# Patient Record
Sex: Male | Born: 1990 | Race: Black or African American | Hispanic: No | Marital: Single | Smoking: Former smoker
Health system: Southern US, Community
[De-identification: ages and names within clinical notes are randomized; demographics above are authoritative.]

## PROBLEM LIST (undated history)

## (undated) HISTORY — PX: KNEE SURGERY: SHX244

---

## 2011-07-18 ENCOUNTER — Emergency Department (HOSPITAL_COMMUNITY): Payer: Self-pay

## 2011-07-18 ENCOUNTER — Emergency Department (HOSPITAL_COMMUNITY)
Admission: EM | Admit: 2011-07-18 | Discharge: 2011-07-18 | Disposition: A | Discharge status: Home / Self Care | Payer: Self-pay | Attending: Emergency Medicine | Admitting: Emergency Medicine

## 2011-07-18 DIAGNOSIS — R0789 Other chest pain: Secondary | ICD-10-CM | POA: Insufficient documentation

## 2012-03-08 ENCOUNTER — Encounter (HOSPITAL_COMMUNITY): Payer: Self-pay | Admitting: Emergency Medicine

## 2012-03-08 ENCOUNTER — Emergency Department (HOSPITAL_COMMUNITY)
Admission: EM | Admit: 2012-03-08 | Discharge: 2012-03-09 | Disposition: A | Discharge status: Home / Self Care | Payer: Self-pay | Attending: Emergency Medicine | Admitting: Emergency Medicine

## 2012-03-08 DIAGNOSIS — B09 Unspecified viral infection characterized by skin and mucous membrane lesions: Secondary | ICD-10-CM | POA: Insufficient documentation

## 2012-03-08 DIAGNOSIS — J069 Acute upper respiratory infection, unspecified: Secondary | ICD-10-CM | POA: Insufficient documentation

## 2012-03-08 NOTE — ED Notes (Signed)
PT reports rash for a week on arms, back, stomach and neck. Has not taken meds for it. Reports itching and no other symptoms.

## 2012-03-09 MED ORDER — DIPHENHYDRAMINE HCL 25 MG PO TABS: 25.0000 mg | ORAL_TABLET | Freq: Four times a day (QID) | ORAL | Status: DC

## 2012-03-09 MED ORDER — HYDROCORTISONE 0.5 % EX CREA: TOPICAL_CREAM | Freq: Two times a day (BID) | CUTANEOUS | Status: DC

## 2012-03-09 NOTE — ED Provider Notes (Signed)
Medical screening examination/treatment/procedure(s) were performed by non-physician practitioner and as supervising physician I was immediately available for consultation/collaboration.   Laray Anger, DO 03/09/12 1014

## 2012-03-09 NOTE — Discharge Instructions (Signed)
Viral Exanthems, Adult  Many viral infections of the skin are called viral exanthems. Exanthem is another name for a rash or skin eruption. The most common viral exanthems include the following:   German measles or rubella.   Measles or rubeola.   Roseola.   Parvovirus B19 (Erythema infectiosum or Fifth disease).   Chickenpox or varicella.  DIAGNOSIS   Sometimes, other problems may cause a rash that looks like a viral exanthem. Most often, your caregiver can determine whether you have a viral exanthem by looking at the rash. They usually have distinct patterns or appearance. Lab work may be done if the diagnosis is uncertain. Sometimes, a small tissue sample (biopsy) of the rash may need to be taken.  TREATMENT   Immunizations have led to a decrease in the number of cases of measles, mumps, and rubella. Viral exanthems may require clinical treatment if a bacterial infection or other problems follow. The rash may be associated with:   Minor sore throat.   Aches and pains.   Runny nose.   Watery eyes.   Tiredness.   Some coughs.   Gastrointestinal infections causing nausea, vomiting, and diarrhea.  Viral exanthems do not respond to antibiotic medicines, because they are not caused by bacteria.  HOME CARE INSTRUCTIONS    Only take over-the-counter or prescription medicines for pain, discomfort, diarrhea, or fever as directed by your caregiver.   Drink enough water and fluids to keep your urine clear or pale yellow.  SEEK MEDICAL CARE IF:   You develop swollen neck glands. This may feel like lumps or bumps in the neck.   You develop tenderness over your sinuses.   You are not feeling partly better after 3 days.   You develop muscle aches.   You are feeling more tired than you would expect.   You get a persistent cough with mucus.  SEEK IMMEDIATE MEDICAL CARE IF:    You have a fever.   You develop red eyes or eye pain.   You develop sores in your mouth and difficulty drinking or eating.   You  develop a sore throat with pus and difficulty swallowing.   You develop neck pain or a stiff neck.   You develop a severe headache.   You develop vomiting that will not stop.  Document Released: 01/31/2003 Document Revised: 10/30/2011 Document Reviewed: 01/28/2011  [employer] Patient Information 2012 [employer].

## 2012-03-09 NOTE — ED Notes (Signed)
Pt states rash on right arm and around neck. Pt states that it is itching and was concerned when it spread to his neck. Pt has red bumps down arm.

## 2012-03-09 NOTE — ED Provider Notes (Signed)
History     CSN: [id number]  Arrival date & time 03/08/12  2043   First MD Initiated Contact with Patient 03/09/12 0001      Chief Complaint  Patient presents with  . Rash    (Consider location/radiation/quality/duration/timing/severity/associated sxs/prior treatment) HPI  21 year old male presents with a chief complaint of rash. Patient has noticed a rash for the past week. Rash initially started on his right forearm that radiates up to his arm, and now he noticed rash to the back of his neck, and also to his abdomen. Describe rash was gradual onset. Rash is itching, but denies burning sensation or pus drainage. Patient denies environmental changes, new detergent, new pets, travel to the [place], or medication changes. He denies fever, or headache. States he has been having cold symptoms for the same amount of time, with sneezing, coughing, and runny nose. Has not tried anything to alleviate his symptoms. He denies focal swelling, chest pain, shortness of breath, nausea, vomiting, diarrhea, or abdominal pain.  No past medical history on file.  No past surgical history on file.  No family history on file.  History  Substance Use Topics  . Smoking status: Not on file  . Smokeless tobacco: Not on file  . Alcohol Use: Not on file      Review of Systems  All other systems reviewed and are negative.    Allergies  Review of patient's allergies indicates no known allergies.  Home Medications  No current outpatient prescriptions on file.  BP 112/72  Pulse 83  Temp(Src) 97.4 F (36.3 C) (Oral)  Resp 18  SpO2 100%  Physical Exam  Nursing note and vitals reviewed. Constitutional: He is oriented to person, place, and time. He appears well-developed and well-nourished. No distress.  HENT:  Head: Normocephalic and atraumatic.  Right Ear: External ear normal.  Nose: Rhinorrhea present.  Mouth/Throat: Oropharynx is clear and moist. No oropharyngeal exudate.  Eyes:  Conjunctivae and EOM are normal.  Neck: Neck supple.  Cardiovascular: Normal rate and regular rhythm.   Pulmonary/Chest: Effort normal and breath sounds normal. No respiratory distress. He has no wheezes. He exhibits no tenderness.  Abdominal: Soft. Bowel sounds are normal. He exhibits no distension. There is no tenderness.  Musculoskeletal: Normal range of motion.  Lymphadenopathy:    He has no cervical adenopathy.  Neurological: He is alert and oriented to person, place, and time.  Skin: Skin is warm. Rash noted.       Maculopapular rash noted to right forearm, posterior aspects of neck, and abdomen. Rash is not petechial, non-pustular. No erythema noted.     ED Course  Procedures (including critical care time)  Labs Reviewed - No data to display No results found.   No diagnosis found.    MDM  Patient with viral exanthem, likely secondary to URI. No evidence of GI involvement. Patient is afebrile stable normal vital signs. No one else in family has similar rash. Rash does not affect the palms of his hand or the sole of his foot.  I recommend Benadryl, and hydrocortisone cream for symptom treatment. Patient agrees to follow with his primary care Dr. for further evaluation.        Fayrene Helper, PA-C 03/09/12 [phone]

## 2013-02-23 ENCOUNTER — Emergency Department (HOSPITAL_COMMUNITY)
Admission: EM | Admit: 2013-02-23 | Discharge: 2013-02-23 | Disposition: A | Discharge status: Home / Self Care | Payer: Self-pay | Attending: Emergency Medicine | Admitting: Emergency Medicine

## 2013-02-23 ENCOUNTER — Encounter (HOSPITAL_COMMUNITY): Payer: Self-pay | Admitting: Emergency Medicine

## 2013-02-23 DIAGNOSIS — R197 Diarrhea, unspecified: Secondary | ICD-10-CM | POA: Insufficient documentation

## 2013-02-23 DIAGNOSIS — R6883 Chills (without fever): Secondary | ICD-10-CM | POA: Insufficient documentation

## 2013-02-23 DIAGNOSIS — Z87891 Personal history of nicotine dependence: Secondary | ICD-10-CM | POA: Insufficient documentation

## 2013-02-23 DIAGNOSIS — R109 Unspecified abdominal pain: Secondary | ICD-10-CM | POA: Insufficient documentation

## 2013-02-23 DIAGNOSIS — R112 Nausea with vomiting, unspecified: Secondary | ICD-10-CM | POA: Insufficient documentation

## 2013-02-23 LAB — COMPREHENSIVE METABOLIC PANEL
ALT: 13 U/L (ref 0–53)
Alkaline Phosphatase: 60 U/L (ref 39–117)
BUN: 19 mg/dL (ref 6–23)
CO2: 26 mEq/L (ref 19–32)
GFR calc Af Amer: >90 mL/min (ref >90)
GFR calc non Af Amer: >90 mL/min (ref >90)
Glucose, Bld: 109 mg/dL — ABNORMAL HIGH (ref 70–99)
Potassium: 4 mEq/L (ref 3.5–5.1)
Sodium: 141 mEq/L (ref 135–145)
Total Bilirubin: 0.4 mg/dL (ref 0.3–1.2)

## 2013-02-23 LAB — CBC
HCT: 40.1 % (ref 39.0–52.0)
Hemoglobin: 13.4 g/dL (ref 13.0–17.0)
RBC: 4.9 MIL/uL (ref 4.22–5.81)

## 2013-02-23 MED ORDER — SODIUM CHLORIDE 0.9 % IV SOLN
1000.0000 mL | Freq: Once | INTRAVENOUS | Status: AC
  Administered 2013-02-23: 1000 mL via INTRAVENOUS

## 2013-02-23 MED ORDER — ONDANSETRON 8 MG PO TBDP: 8.0000 mg | ORAL_TABLET | Freq: Three times a day (TID) | ORAL | Status: DC | PRN

## 2013-02-23 MED ORDER — ONDANSETRON HCL 4 MG/2ML IJ SOLN
4.0000 mg | Freq: Once | INTRAMUSCULAR | Status: AC
  Administered 2013-02-23: 4 mg via INTRAVENOUS
  Filled 2013-02-23: qty 2

## 2013-02-23 MED ORDER — SODIUM CHLORIDE 0.9 % IV SOLN: 1000.0000 mL | INTRAVENOUS | Status: DC

## 2013-02-23 MED ORDER — MORPHINE SULFATE 4 MG/ML IJ SOLN
4.0000 mg | Freq: Once | INTRAMUSCULAR | Status: AC
  Administered 2013-02-23: 4 mg via INTRAVENOUS
  Filled 2013-02-23: qty 1

## 2013-02-23 NOTE — ED Notes (Signed)
Lab in room at this time. 

## 2013-02-23 NOTE — ED Notes (Signed)
Patient with abdominal pain, nausea, vomiting and diarrhea.  Patient states that the pain started after he ate dinner last night.  Patient states that the vomiting started around 2300, along with diarrhea.  Patient states that the abdominal pain is continuous, stating that the pain is all over upper abdomen.

## 2013-02-23 NOTE — ED Notes (Signed)
Patient given discharge instructions for N/V/D. rx for zofran. Advised to follow up with primary care or to return to this dept if condition worsens. Patient voiced understanding of all instructions and had no further questions. Patient ambulated to front lobby without difficulty.

## 2013-02-23 NOTE — ED Notes (Signed)
Patient provided cold rag, states he is hot at this time. Temp was 99.6 oral.

## 2013-02-23 NOTE — ED Provider Notes (Signed)
History     CSN: [id number]  Arrival date & time 02/23/13  0419   First MD Initiated Contact with Patient 02/23/13 0425      Chief Complaint  Patient presents with  . Nausea  . Emesis  . Diarrhea    (Consider location/radiation/quality/duration/timing/severity/associated sxs/prior treatment) The history is provided by the patient.   the patient reports nausea vomiting and diarrhea over the past 9 hours.  He denies blood in his stool or blood in his vomit.  Chills without documented fever.  He reports crampy upper abdominal discomfort without focality.  No recent sick contacts.  No flank pain.  No back pain.  No other complaints.  He is healthy young at 22 years old.  His symptoms are mild to moderate in severity.  History reviewed. No pertinent past medical history.  History reviewed. No pertinent past surgical history.  History reviewed. No pertinent family history.  History  Substance Use Topics  . Smoking status: Former Games developer  . Smokeless tobacco: Not on file  . Alcohol Use: No     Comment: socially      Review of Systems  Gastrointestinal: Positive for vomiting and diarrhea.  All other systems reviewed and are negative.    Allergies  Review of patient's allergies indicates no known allergies.  Home Medications   Current Outpatient Rx  Name  Route  Sig  Dispense  Refill  . ondansetron (ZOFRAN ODT) 8 MG disintegrating tablet   Oral   Take 1 tablet (8 mg total) by mouth every 8 (eight) hours as needed for nausea.   10 tablet   0     BP 117/61  Pulse 95  Temp(Src) 99.6 F (37.6 C) (Oral)  Resp 20  SpO2 100%  Physical Exam  Nursing note and vitals reviewed. Constitutional: He is oriented to person, place, and time. He appears well-developed and well-nourished.  HENT:  Head: Normocephalic and atraumatic.  Eyes: EOM are normal.  Neck: Normal range of motion.  Cardiovascular: Normal rate, regular rhythm, normal heart sounds and intact distal pulses.    Pulmonary/Chest: Effort normal and breath sounds normal. No respiratory distress.  Abdominal: Soft. He exhibits no distension. There is no tenderness.  Musculoskeletal: Normal range of motion.  Neurological: He is alert and oriented to person, place, and time.  Skin: Skin is warm and dry.  Psychiatric: He has a normal mood and affect. Judgment normal.    ED Course  Procedures (including critical care time)  Labs Reviewed  COMPREHENSIVE METABOLIC PANEL - Abnormal; Notable for the following:    Glucose, Bld 109 (*)    All other components within normal limits  CBC   No results found.   1. Nausea vomiting and diarrhea       MDM  Likely viral gastroenteritis. abd benign on exam. Doubt appendicitis. A zofran given. Non toxic appearance.   6:46 AM Patient feels much better after pain medicine and IV nausea medicine.  He was given fluids.  His repeat abdominal exam is benign.  Likely viral in nature.  Discharge home in good condition.         Lyanne Co, MD 02/23/13 [phone]

## 2013-02-23 NOTE — ED Notes (Signed)
Patient laying on stretcher at this time. Patient is complaining of abdominal pain, nausea, vomiting and diarrhea. Patient is guarding and moaning in pain. Plan of care discussed with patient. Family member at bedside with patient. Call light at bedside. Will continue to monitor patient.

## 2013-02-23 NOTE — ED Notes (Signed)
Patient sleeping comfortably at this time. Awoke patient, states he is feeling better. Still experiencing some pain. No acute distress noted, resp are even and unlabored. Family member at bedside. Call light at bedside. Will continue to monitor.

## 2014-01-31 ENCOUNTER — Encounter (HOSPITAL_COMMUNITY): Payer: Self-pay | Admitting: Emergency Medicine

## 2014-01-31 ENCOUNTER — Emergency Department (HOSPITAL_COMMUNITY): Payer: Self-pay

## 2014-01-31 ENCOUNTER — Emergency Department (HOSPITAL_COMMUNITY)
Admission: EM | Admit: 2014-01-31 | Discharge: 2014-01-31 | Disposition: A | Discharge status: Home / Self Care | Payer: Self-pay | Attending: Emergency Medicine | Admitting: Emergency Medicine

## 2014-01-31 DIAGNOSIS — M7021 Olecranon bursitis, right elbow: Secondary | ICD-10-CM

## 2014-01-31 DIAGNOSIS — Z87891 Personal history of nicotine dependence: Secondary | ICD-10-CM | POA: Insufficient documentation

## 2014-01-31 DIAGNOSIS — M25521 Pain in right elbow: Secondary | ICD-10-CM

## 2014-01-31 DIAGNOSIS — M7020 Olecranon bursitis, unspecified elbow: Secondary | ICD-10-CM | POA: Insufficient documentation

## 2014-01-31 MED ORDER — IBUPROFEN 800 MG PO TABS: 800.0000 mg | ORAL_TABLET | Freq: Three times a day (TID) | ORAL | Status: AC

## 2014-01-31 MED ORDER — ACETAMINOPHEN-CODEINE #3 300-30 MG PO TABS
1.0000 | ORAL_TABLET | Freq: Four times a day (QID) | ORAL | Status: AC | PRN
Start: 2014-01-31 — End: ?

## 2014-01-31 MED ORDER — IBUPROFEN 400 MG PO TABS
800.0000 mg | ORAL_TABLET | Freq: Once | ORAL | Status: DC
  Filled 2014-01-31: qty 2

## 2014-01-31 NOTE — ED Provider Notes (Signed)
CSN: [id number]     Arrival date & time 01/31/14  2014 History   First MD Initiated Contact with Patient 01/31/14 2023 This chart was scribed for non-physician practitioner Francee PiccoloJennifer Cleotis Sparr, PA-C working with Leonette Mostharles B. Bernette MayersSheldon, MD by Valera CastleSteven Perry, ED scribe. This patient was seen in room TR07C/TR07C and the patient's care was started at 9:59 PM.     Chief Complaint  Patient presents with  . Arm Pain   (Consider location/radiation/quality/duration/timing/severity/associated sxs/prior Treatment) The history is provided by the patient. No language interpreter was used.   HPI Comments: Willie Ramsey is a 23 y.o. male who presents to the Emergency Department complaining of constant, right elbow pain, over the back of his elbow, that radiates down his right arm, with associated right hand numbness, onset earlier this evening while sitting down. He reports associated intermittent pins and needles sensation in his right hand. He denies any obvious injury. He denies any repetitive arm motion at work or with sports. He denies lifting heavy objects recently. He denies taking any pain medication and denies applying ice. He denies h/o right elbow problem, surgeries, recent travel, recent casts. He denies SOB, and any other associated symptoms.   He experienced an episode of anxiety with an episode of increased pain, with associated palpitations, feeling flushed and nauseated, but states the symptoms have passed and only lasted a few seconds, and is preferable to not talking about it.   PCP Lafe Garin- KALLIANOS,JOHN, MD  History reviewed. No pertinent past medical history. History reviewed. No pertinent past surgical history. History reviewed. No pertinent family history. History  Substance Use Topics  . Smoking status: Former Games developermoker  . Smokeless tobacco: Not on file  . Alcohol Use: No     Comment: socially    Review of Systems  Constitutional: Negative for fever and chills.  Respiratory: Negative for  shortness of breath.   Musculoskeletal: Positive for arthralgias (right elbow).  All other systems reviewed and are negative.   Allergies  Review of patient's allergies indicates no known allergies.  Home Medications   Current Outpatient Rx  Name  Route  Sig  Dispense  Refill  . Multiple Vitamin (MULTI-VITAMIN PO)   Oral   Take 1 tablet by mouth daily.         Marland Kitchen. acetaminophen-codeine (TYLENOL #3) 300-30 MG per tablet   Oral   Take 1-2 tablets by mouth every 6 (six) hours as needed for severe pain.   10 tablet   0   . ibuprofen (ADVIL,MOTRIN) 800 MG tablet   Oral   Take 1 tablet (800 mg total) by mouth 3 (three) times daily.   21 tablet   0    BP 115/74  Pulse 68  Temp(Src) 98.2 F (36.8 C) (Oral)  Resp 16  SpO2 100%  Physical Exam  Nursing note and vitals reviewed. Constitutional: He is oriented to person, place, and time. He appears well-developed and well-nourished. No distress.  HENT:  Head: Normocephalic and atraumatic.  Right Ear: External ear normal.  Left Ear: External ear normal.  Nose: Nose normal.  Mouth/Throat: Oropharynx is clear and moist.  Eyes: Conjunctivae are normal.  Neck: Normal range of motion. Neck supple.  Cardiovascular: Normal rate, regular rhythm, normal heart sounds and intact distal pulses.   Pulmonary/Chest: Effort normal and breath sounds normal. No respiratory distress. He exhibits no tenderness.  Abdominal: Soft. There is no tenderness.  Musculoskeletal: Normal range of motion.       Right shoulder: Normal.  Right elbow: He exhibits normal range of motion, no swelling, no effusion, no deformity and no laceration. Tenderness found. Olecranon process tenderness noted.       Left elbow: Normal.       Right wrist: Normal.       Left wrist: Normal.       Right upper arm: Normal.       Right forearm: Normal.       Left forearm: Normal.       Right hand: Normal.       Left hand: Normal.  Neurological: He is alert and oriented  to person, place, and time.  Skin: Skin is warm and dry. He is not diaphoretic.  Psychiatric: He has a normal mood and affect.    ED Course  Procedures (including critical care time)  COORDINATION OF CARE: 10:04 PM-Discussed treatment plan which includes ice and pain medication with pt at bedside and pt agreed to plan.   Dg Elbow Complete Right  01/31/2014   CLINICAL DATA Sudden pain in the elbow  EXAM RIGHT ELBOW - COMPLETE 3+ VIEW  COMPARISON None.  FINDINGS There is no acute fracture or dislocation. There is no arthropathy. There is visualization of the posterior fat pad suggesting a joint effusion.  IMPRESSION No acute osseous injury of the right elbow. Nonspecific small joint effusion. If there is clinical concern regarding an occult injury, follow-up radiographs or MRI of the right elbow is recommended.  SIGNATURE  Electronically Signed   By: Elige Ko   On: 01/31/2014 21:24   Dg Forearm Right  01/31/2014   CLINICAL DATA Elbow pain and hand numbness  EXAM RIGHT FOREARM - 2 VIEW  COMPARISON None.  FINDINGS There is no evidence of fracture or other focal bone lesions. Soft tissues are unremarkable.  IMPRESSION Negative.  SIGNATURE  Electronically Signed   By: Elige Ko   On: 01/31/2014 21:27    EKG Interpretation None     Medications  ibuprofen (ADVIL,MOTRIN) tablet 800 mg (800 mg Oral Not Given 01/31/14 2217)    MDM   Final diagnoses:  Right elbow pain  Olecranon bursitis of right elbow    Filed Vitals:   01/31/14 2218  BP: 115/74  Pulse: 68  Temp: 98.2 F (36.8 C)  Resp: 16    Afebrile, NAD, non-toxic appearing, AAOx4.   Neurovascularly intact. Normal sensation. Imaging shows no fracture. Directed pt to ice injury, take acetaminophen or ibuprofen for pain, and to elevate and rest the injury when possible.   EKG was obtained because patient was complaining of palpitations with an episode of anxious feelings and increased pain. NSR, no acute abnormalities.  Patient states his symptoms resolved and does not feel like he needs to be evaluated for this.    Return precautions discussed. Patient is agreeable to plan. Patient is stable at time of discharge   I personally performed the services described in this documentation, which was scribed in my presence. The recorded information has been reviewed and is accurate.     Jeannetta Ellis, PA-C 02/01/14 [phone]

## 2014-01-31 NOTE — ED Notes (Signed)
Per pt sts pain in elbow area and radiating down right forearm. sts right hand feels numb. sts after all this his chest started to hurt. Denies injury

## 2014-01-31 NOTE — Discharge Instructions (Signed)
Please follow up with your primary care physician in 1-2 days. If you do not have one please [facility] listed above. Please take pain medication and/or muscle relaxants as prescribed and as needed for pain. Please do not drive on narcotic pain medication or on muscle relaxants. Please follow RICE method below. Please read all discharge instructions and return precautions.    Olecranon Bursitis Bursitis is swelling and soreness (inflammation) of a fluid-filled sac (bursa) that covers and protects a joint. Olecranon bursitis occurs over the elbow.  CAUSES Bursitis can be caused by injury, overuse of the joint, arthritis, or infection.  SYMPTOMS   Tenderness, swelling, warmth, or redness over the elbow.  Elbow pain with movement. This is greater with bending the elbow.  Squeaking sound when the bursa is rubbed or moved.  Increasing size of the bursa without pain or discomfort.  Fever with increasing pain and swelling if the bursa becomes infected. HOME CARE INSTRUCTIONS   Put ice on the affected area.  Put ice in a plastic bag.  Place a towel between your skin and the bag.  Leave the ice on for 15-20 minutes each hour while awake. Do this for the first 2 days.  When resting, elevate your elbow above the level of your heart. This helps reduce swelling.  Continue to put the joint through a full range of motion 4 times per day. Rest the injured joint at other times. When the pain lessens, begin normal slow movements and usual activities.  Only take over-the-counter or prescription medicines for pain, discomfort, or fever as directed by your caregiver.  Reduce your intake of milk and related dairy products (cheese, yogurt). They may make your condition worse. SEEK IMMEDIATE MEDICAL CARE IF:   Your pain increases even during treatment.  You have a fever.  You have heat and inflammation over the bursa and elbow.  You have a red line that goes up  your arm.  You have pain with movement of your elbow. MAKE SURE YOU:   Understand these instructions.  Will watch your condition.  Will get help right away if you are not doing well or get worse. Document Released: 12/10/2006 Document Revised: 02/02/2012 Document Reviewed: 10/26/2007 [facility] Patient Information 2014 [employer] [place].  RICE: Routine Care for Injuries The routine care of many injuries includes Rest, Ice, Compression, and Elevation (RICE). HOME CARE INSTRUCTIONS  Rest is needed to allow your body to heal. Routine activities can usually be resumed when comfortable. Injured tendons and bones can take up to 6 weeks to heal. Tendons are the cord-like structures that attach muscle to bone.  Ice following an injury helps keep the swelling down and reduces pain.  Put ice in a plastic bag.  Place a towel between your skin and the bag.  Leave the ice on for 15-20 minutes, 03-04 times a day. Do this while awake, for the first 24 to 48 hours. After that, continue as directed by your caregiver.  Compression helps keep swelling down. It also gives support and helps with discomfort. If an elastic bandage has been applied, it should be removed and reapplied every 3 to 4 hours. It should not be applied tightly, but firmly enough to keep swelling down. Watch fingers or toes for swelling, bluish discoloration, coldness, numbness, or excessive pain. If any of these problems occur, remove the bandage and reapply loosely. Contact your caregiver if these problems continue.  Elevation helps reduce swelling and decreases pain. With  extremities, such as the arms, hands, legs, and feet, the injured area should be placed near or above the level of the heart, if possible. SEEK IMMEDIATE MEDICAL CARE IF:  You have persistent pain and swelling.  You develop redness, numbness, or unexpected weakness.  Your symptoms are getting worse rather than improving after several days. These symptoms may  indicate that further evaluation or further X-rays are needed. Sometimes, X-rays may not show a small broken bone (fracture) until 1 week or 10 days later. Make a follow-up appointment with your caregiver. Ask when your X-ray results will be ready. Make sure you get your X-ray results. Document Released: 02/22/2001 Document Revised: 02/02/2012 Document Reviewed: 04/11/2011 [facility] Patient Information 2014 [place], [place].

## 2014-01-31 NOTE — ED Notes (Signed)
Patient transported to X-ray 

## 2014-02-01 NOTE — ED Provider Notes (Signed)
Medical screening examination/treatment/procedure(s) were performed by non-physician practitioner and as supervising physician I was immediately available for consultation/collaboration.   EKG Interpretation None        Charles B. Sheldon, MD 02/01/14 1017 

## 2019-12-26 ENCOUNTER — Ambulatory Visit: Payer: Self-pay | Attending: Internal Medicine

## 2019-12-26 DIAGNOSIS — Z20822 Contact with and (suspected) exposure to covid-19: Secondary | ICD-10-CM | POA: Insufficient documentation

## 2019-12-27 LAB — NOVEL CORONAVIRUS, NAA: SARS-CoV-2, NAA: NOT DETECTED

## 2020-01-04 ENCOUNTER — Ambulatory Visit: Payer: Self-pay | Attending: Internal Medicine

## 2020-01-04 DIAGNOSIS — Z20822 Contact with and (suspected) exposure to covid-19: Secondary | ICD-10-CM | POA: Insufficient documentation

## 2020-01-05 LAB — NOVEL CORONAVIRUS, NAA: SARS-CoV-2, NAA: NOT DETECTED

## 2020-01-11 ENCOUNTER — Ambulatory Visit: Payer: Self-pay | Attending: Internal Medicine

## 2020-01-11 DIAGNOSIS — Z20822 Contact with and (suspected) exposure to covid-19: Secondary | ICD-10-CM | POA: Insufficient documentation

## 2020-01-13 LAB — NOVEL CORONAVIRUS, NAA: SARS-CoV-2, NAA: NOT DETECTED

## 2020-01-18 ENCOUNTER — Ambulatory Visit: Payer: Self-pay | Attending: Internal Medicine

## 2020-01-18 DIAGNOSIS — Z20822 Contact with and (suspected) exposure to covid-19: Secondary | ICD-10-CM | POA: Insufficient documentation

## 2020-01-19 LAB — NOVEL CORONAVIRUS, NAA: SARS-CoV-2, NAA: NOT DETECTED

## 2020-01-25 ENCOUNTER — Ambulatory Visit: Payer: Self-pay | Attending: Internal Medicine

## 2020-01-25 DIAGNOSIS — Z20822 Contact with and (suspected) exposure to covid-19: Secondary | ICD-10-CM

## 2020-01-26 LAB — NOVEL CORONAVIRUS, NAA: SARS-CoV-2, NAA: NOT DETECTED

## 2020-02-02 ENCOUNTER — Other Ambulatory Visit: Payer: Self-pay

## 2020-02-16 ENCOUNTER — Other Ambulatory Visit: Payer: Self-pay

## 2020-03-06 ENCOUNTER — Other Ambulatory Visit: Payer: Self-pay

## 2020-03-06 ENCOUNTER — Encounter: Payer: Self-pay | Admitting: *Deleted

## 2020-03-06 ENCOUNTER — Emergency Department
Admission: EM | Admit: 2020-03-06 | Discharge: 2020-03-07 | Disposition: A | Discharge status: Home / Self Care | Payer: Self-pay | Attending: Emergency Medicine | Admitting: Emergency Medicine

## 2020-03-06 ENCOUNTER — Emergency Department: Payer: Self-pay

## 2020-03-06 DIAGNOSIS — R1013 Epigastric pain: Secondary | ICD-10-CM | POA: Insufficient documentation

## 2020-03-06 DIAGNOSIS — Z87891 Personal history of nicotine dependence: Secondary | ICD-10-CM | POA: Insufficient documentation

## 2020-03-06 DIAGNOSIS — R200 Anesthesia of skin: Secondary | ICD-10-CM | POA: Insufficient documentation

## 2020-03-06 DIAGNOSIS — R042 Hemoptysis: Secondary | ICD-10-CM | POA: Insufficient documentation

## 2020-03-06 LAB — CBC
HCT: 38.4 % — ABNORMAL LOW (ref 39.0–52.0)
Hemoglobin: 12.6 g/dL — ABNORMAL LOW (ref 13.0–17.0)
MCH: 27.3 pg (ref 26.0–34.0)
MCHC: 32.8 g/dL (ref 30.0–36.0)
MCV: 83.1 fL (ref 80.0–100.0)
Platelets: 277 10*3/uL (ref 150–400)
RBC: 4.62 MIL/uL (ref 4.22–5.81)
RDW: 12.3 % (ref 11.5–15.5)
WBC: 6.5 10*3/uL (ref 4.0–10.5)
nRBC: 0 % (ref 0.0–0.2)

## 2020-03-06 LAB — COMPREHENSIVE METABOLIC PANEL
ALT: 28 U/L (ref 0–44)
AST: 25 U/L (ref 15–41)
Albumin: 4.9 g/dL (ref 3.5–5.0)
Alkaline Phosphatase: 56 U/L (ref 38–126)
Anion gap: 9 (ref 5–15)
BUN: 17 mg/dL (ref 6–20)
CO2: 28 mmol/L (ref 22–32)
Calcium: 9.2 mg/dL (ref 8.9–10.3)
Chloride: 102 mmol/L (ref 98–111)
Creatinine, Ser: 1.08 mg/dL (ref 0.61–1.24)
GFR calc Af Amer: >60 mL/min (ref >60)
GFR calc non Af Amer: >60 mL/min (ref >60)
Glucose, Bld: 96 mg/dL (ref 70–99)
Potassium: 3.6 mmol/L (ref 3.5–5.1)
Sodium: 139 mmol/L (ref 135–145)
Total Bilirubin: 0.6 mg/dL (ref 0.3–1.2)
Total Protein: 8 g/dL (ref 6.5–8.1)

## 2020-03-06 LAB — LIPASE, BLOOD: Lipase: 30 U/L (ref 11–51)

## 2020-03-06 MED ORDER — IOHEXOL 9 MG/ML PO SOLN
500.0000 mL | ORAL | Status: DC
  Administered 2020-03-06: 500 mL via ORAL

## 2020-03-06 MED ORDER — SODIUM CHLORIDE 0.9% FLUSH: 3.0000 mL | Freq: Once | INTRAVENOUS | Status: DC

## 2020-03-06 NOTE — ED Triage Notes (Signed)
Pt to ED with multiple medical complaints. Pt reportign he has started to cough up bright red blood today. No clots but pt reports significant bright red blood. No vomiting but cough and SOB intermittently.   Pt also reports he has been dealing with abd "burning" that he has been trying to schedule a CT scan for but has only had testing done for GERD that he reports showed he did not have GERD. No NVD.   Pt also stating numbness in his head without headache. No numbness in arms or legs and no neuro deficits noted. NO recent head injuries.

## 2020-03-06 NOTE — ED Provider Notes (Signed)
[facility] Emergency Department Provider Note  ____________________________________________   First MD Initiated Contact with Patient 03/06/20 2318     (approximate)  I have reviewed the triage vital signs and the nursing notes.   HISTORY  Chief Complaint Hemoptysis, Abdominal Pain, and Numbness    HPI Willie Rico Massar. is a 29 y.o. male presents to the emergency department secondary to a 1 month history of epigastric abdominal pain that is intermittent in nature.  Patient describes the pain as burning.  Patient does admit to "spitting up blood today that was bright red.  Patient states that he was prescribed Protonix in the past however he was informed by his primary care provider that he does not have reflux and as such Protonix was discontinued.  Patient does admit to a history of heavy EtOH ingestion in the past however states that he only drinks twice per week at present most recently 2 days ago..  Patient denies any diarrhea.  Patient denies any fever.  Patient denies any urinary symptoms.  He denies any chest pain no shortness of breath      History reviewed. No pertinent past medical history.  There are no problems to display for this patient.   History reviewed. No pertinent surgical history.  Prior to Admission medications   Medication Sig Start Date End Date Taking? Authorizing Provider  acetaminophen-codeine (TYLENOL #3) 300-30 MG per tablet Take 1-2 tablets by mouth every 6 (six) hours as needed for severe pain. 01/31/14   Piepenbrink, Victorino Dike, PA-C  ibuprofen (ADVIL,MOTRIN) 800 MG tablet Take 1 tablet (800 mg total) by mouth 3 (three) times daily. 01/31/14   Piepenbrink, Victorino Dike, PA-C  Multiple Vitamin (MULTI-VITAMIN PO) Take 1 tablet by mouth daily.    [provider]    Allergies Patient has no known allergies.  History reviewed. No pertinent family history.  Social History Social History   Tobacco Use  . Smoking  status: Former Games developer  . Smokeless tobacco: Never Used  Substance Use Topics  . Alcohol use: No    Comment: socially  . Drug use: No    Review of Systems Constitutional: No fever/chills Eyes: No visual changes. ENT: No sore throat. Cardiovascular: Denies chest pain. Respiratory: Denies shortness of breath. Gastrointestinal: Positive for epigastric abdominal pain. no nausea, no vomiting.  No diarrhea.  No constipation. Genitourinary: Negative for dysuria. Musculoskeletal: Negative for neck pain.  Negative for back pain. Integumentary: Negative for rash. Neurological: Negative for headaches, focal weakness or numbness.   ____________________________________________   PHYSICAL EXAM:  VITAL SIGNS: ED Triage Vitals  Enc Vitals Group     BP 03/06/20 1932 (!) 151/94     Pulse Rate 03/06/20 1932 78     Resp 03/06/20 1932 18     Temp 03/06/20 1932 99.2 F (37.3 C)     Temp Source 03/06/20 1932 Oral     SpO2 03/06/20 1932 100 %     Weight 03/06/20 1932 93.9 kg (207 lb)     Height 03/06/20 1932 1.753 m (5\' 9" )     Head Circumference --      Peak Flow --      Pain Score 03/06/20 1944 4     Pain Loc --      Pain Edu? --      Excl. in GC? --     Constitutional: Alert and oriented.  Eyes: Conjunctivae are normal.  Mouth/Throat: Patient is wearing a mask. Neck: No stridor.  No meningeal signs.  Cardiovascular: Normal rate, regular rhythm. Good peripheral circulation. Grossly normal heart sounds. Respiratory: Normal respiratory effort.  No retractions. Gastrointestinal: Epigastric tenderness to palpation. No distention.  Musculoskeletal: No lower extremity tenderness nor edema. No gross deformities of extremities. Neurologic:  Normal speech and language. No gross focal neurologic deficits are appreciated.  Skin:  Skin is warm, dry and intact. Psychiatric: Mood and affect are normal. Speech and behavior are normal.  ____________________________________________   LABS (all  labs ordered are listed, but only abnormal results are displayed)  Labs Reviewed  CBC - Abnormal; Notable for the following components:      Result Value   Hemoglobin 12.6 (*)    HCT 38.4 (*)    All other components within normal limits  URINALYSIS, COMPLETE (UACMP) WITH MICROSCOPIC - Abnormal; Notable for the following components:   Color, Urine YELLOW (*)    APPearance CLEAR (*)    All other components within normal limits  LIPASE, BLOOD  COMPREHENSIVE METABOLIC PANEL   ____________________________________________  EKG ED ECG REPORT I, Naschitti N Ranika Mcniel, the attending physician, personally viewed and interpreted this ECG.   Date: 03/06/2020  EKG Time: 7:41 PM  Rate: 71  Rhythm: Normal sinus rhythm  Axis: Normal  Intervals: Normal  ST&T Change: None  ___________________________________  RADIOLOGY I, [place]lin[place] N Shimshon Narula, personally viewed and evaluated these images (plain radiographs) as part of my medical decision making, as well as reviewing the written report by the radiologist.  ED MD interpretation: No acute cardiopulmonary abnormality noted on chest x-ray  CT abdomen pelvis revealed no acute findings per radiologist..  Official radiology report(s): DG Chest 2 View  Result Date: 03/06/2020 CLINICAL DATA:  Shortness of breath and hemoptysis EXAM: CHEST - 2 VIEW COMPARISON:  Radiograph July 18, 2011 FINDINGS: No consolidation, features of edema, pneumothorax, or effusion. Pulmonary vascularity is normally distributed. The cardiomediastinal contours are unremarkable. No acute osseous or soft tissue abnormality. IMPRESSION: No acute cardiopulmonary abnormality. Electronically Signed   By: Lovena Le M.D.   On: 03/06/2020 20:28   CT ABDOMEN PELVIS W CONTRAST  Result Date: 03/07/2020 CLINICAL DATA:  Epigastric pain EXAM: CT ABDOMEN AND PELVIS WITH CONTRAST TECHNIQUE: Multidetector CT imaging of the abdomen and pelvis was performed using the standard protocol following  bolus administration of intravenous contrast. CONTRAST:  174mL OMNIPAQUE IOHEXOL 300 MG/ML  SOLN COMPARISON:  CT 06/12/2015 FINDINGS: Lower chest: No acute abnormality. Hepatobiliary: No focal liver abnormality is seen. No gallstones, gallbladder wall thickening, or biliary dilatation. Pancreas: Unremarkable. No pancreatic ductal dilatation or surrounding inflammatory changes. Spleen: Normal in size without focal abnormality. Adrenals/Urinary Tract: Adrenal glands are unremarkable. Kidneys are normal, without renal calculi, focal lesion, or hydronephrosis. Bladder is unremarkable. Stomach/Bowel: Stomach is within normal limits. Appendix appears normal. No evidence of bowel wall thickening, distention, or inflammatory changes. Vascular/Lymphatic: No significant vascular findings are present. No enlarged abdominal or pelvic lymph nodes. Reproductive: Prostate is unremarkable. Other: No abdominal wall hernia or abnormality. No abdominopelvic ascites. Musculoskeletal: No acute or significant osseous findings. IMPRESSION: Negative. No CT evidence for acute intra-abdominal or pelvic abnormality Electronically Signed   By: Donavan Foil M.D.   On: 03/07/2020 01:19    ____  Procedures   ____________________________________________   INITIAL IMPRESSION / MDM / [place] / ED COURSE  As part of my medical decision making, I reviewed the following data within the electronic MEDICAL RECORD NUMBER   29 year old male presented with above-stated history and physical exam secondary to epigastric abdominal pain with  bright red blood hematemesis/hemoptysis.  Considered possibility of gastritis versus ulcer disease as the etiology for the patient's epigastric pain versus potential other intra-abdominal pathology.  CT scan of the abdomen pelvis revealed no acute findings.  Chest x-ray was performed that also revealed no acute intrathoracic findings.  Given the patient's history suspect gastritis versus ulcer disease  and as such patient was given 40 mg of Protonix p.o. in the emergency department.  Patient will be prescribed Protonix twice daily for home with outpatient follow-up with gastroenterology           ____________________________________________  FINAL CLINICAL IMPRESSION(S) / ED DIAGNOSES  Final diagnoses:  Epigastric pain     MEDICATIONS GIVEN DURING THIS VISIT:  Medications  sodium chloride flush (NS) 0.9 % injection 3 mL (3 mLs Intravenous Not Given 03/06/20 2326)  iohexol (OMNIPAQUE) 9 MG/ML oral solution 500 mL (500 mLs Oral Contrast Given 03/06/20 2351)  ondansetron (ZOFRAN) injection 4 mg (has no administration in time range)  pantoprazole (PROTONIX) EC tablet 40 mg (has no administration in time range)  iohexol (OMNIPAQUE) 300 MG/ML solution 100 mL (100 mLs Intravenous Contrast Given 03/07/20 0053)     ED Discharge Orders    None      *Please note:  Willie Crista Curb. was evaluated in Emergency Department on 03/07/2020 for the symptoms described in the history of present illness. He was evaluated in the context of the global COVID-19 pandemic, which necessitated consideration that the patient might be at risk for infection with the SARS-CoV-2 virus that causes COVID-19. Institutional protocols and algorithms that pertain to the evaluation of patients at risk for COVID-19 are in a state of rapid change based on information released by regulatory bodies including the CDC and federal and state organizations. These policies and algorithms were followed during the patient's care in the ED.  Some ED evaluations and interventions may be delayed as a result of limited staffing during the pandemic.*  Note:  This document was prepared using Dragon voice recognition software and may include unintentional dictation errors.   Darci Current, MD 03/07/20 [phone]

## 2020-03-06 NOTE — ED Notes (Signed)
Pt ambulatory to room with steady gait. Pt appears to be in NAD while ambulating. Pt respirations even and unlabored at this time.

## 2020-03-07 ENCOUNTER — Emergency Department: Payer: Self-pay

## 2020-03-07 ENCOUNTER — Encounter: Payer: Self-pay | Admitting: Radiology

## 2020-03-07 LAB — URINALYSIS, COMPLETE (UACMP) WITH MICROSCOPIC
Bacteria, UA: NONE SEEN
Bilirubin Urine: NEGATIVE
Glucose, UA: NEGATIVE mg/dL
Hgb urine dipstick: NEGATIVE
Ketones, ur: NEGATIVE mg/dL
Leukocytes,Ua: NEGATIVE
Nitrite: NEGATIVE
Protein, ur: NEGATIVE mg/dL
Specific Gravity, Urine: 1.023 (ref 1.005–1.030)
Squamous Epithelial / HPF: NONE SEEN (ref 0–5)
pH: 6 (ref 5.0–8.0)

## 2020-03-07 MED ORDER — ONDANSETRON HCL 4 MG/2ML IJ SOLN
4.0000 mg | Freq: Once | INTRAMUSCULAR | Status: DC
  Filled 2020-03-07: qty 2

## 2020-03-07 MED ORDER — PANTOPRAZOLE SODIUM 40 MG PO TBEC
40.0000 mg | DELAYED_RELEASE_TABLET | Freq: Once | ORAL | Status: AC
  Administered 2020-03-07: 40 mg via ORAL
  Filled 2020-03-07: qty 1

## 2020-03-07 MED ORDER — PANTOPRAZOLE SODIUM 40 MG PO TBEC: 40.0000 mg | DELAYED_RELEASE_TABLET | Freq: Two times a day (BID) | ORAL | 1 refills | Status: AC

## 2020-03-07 MED ORDER — IOHEXOL 300 MG/ML  SOLN
100.0000 mL | Freq: Once | INTRAMUSCULAR | Status: AC | PRN
  Administered 2020-03-07: 100 mL via INTRAVENOUS

## 2020-06-14 ENCOUNTER — Other Ambulatory Visit: Payer: Self-pay

## 2020-06-14 DIAGNOSIS — R42 Dizziness and giddiness: Secondary | ICD-10-CM | POA: Insufficient documentation

## 2020-06-14 DIAGNOSIS — Z5321 Procedure and treatment not carried out due to patient leaving prior to being seen by health care provider: Secondary | ICD-10-CM | POA: Insufficient documentation

## 2020-06-14 DIAGNOSIS — R519 Headache, unspecified: Secondary | ICD-10-CM | POA: Insufficient documentation

## 2020-06-15 ENCOUNTER — Other Ambulatory Visit: Payer: Self-pay

## 2020-06-15 ENCOUNTER — Emergency Department
Admission: EM | Admit: 2020-06-15 | Discharge: 2020-06-15 | Disposition: A | Discharge status: Home / Self Care | Payer: Self-pay | Attending: Emergency Medicine | Admitting: Emergency Medicine

## 2020-06-15 NOTE — ED Triage Notes (Signed)
Pt states for few weeks he has been feeling "tension in shoulders". States causing headaches and dizziness. For past 3 days he has increased his fluid intake because he feels dehydrated, states urine is still dark and symptoms have not improved.

## 2020-06-16 IMAGING — CR DG CHEST 2V
2 series · 2 of 2 positions shown · non-contrast
Comparison: Radiograph July 18, 2011

CLINICAL DATA: Shortness of breath and hemoptysis

EXAM:
CHEST - 2 VIEW

[chest pa]
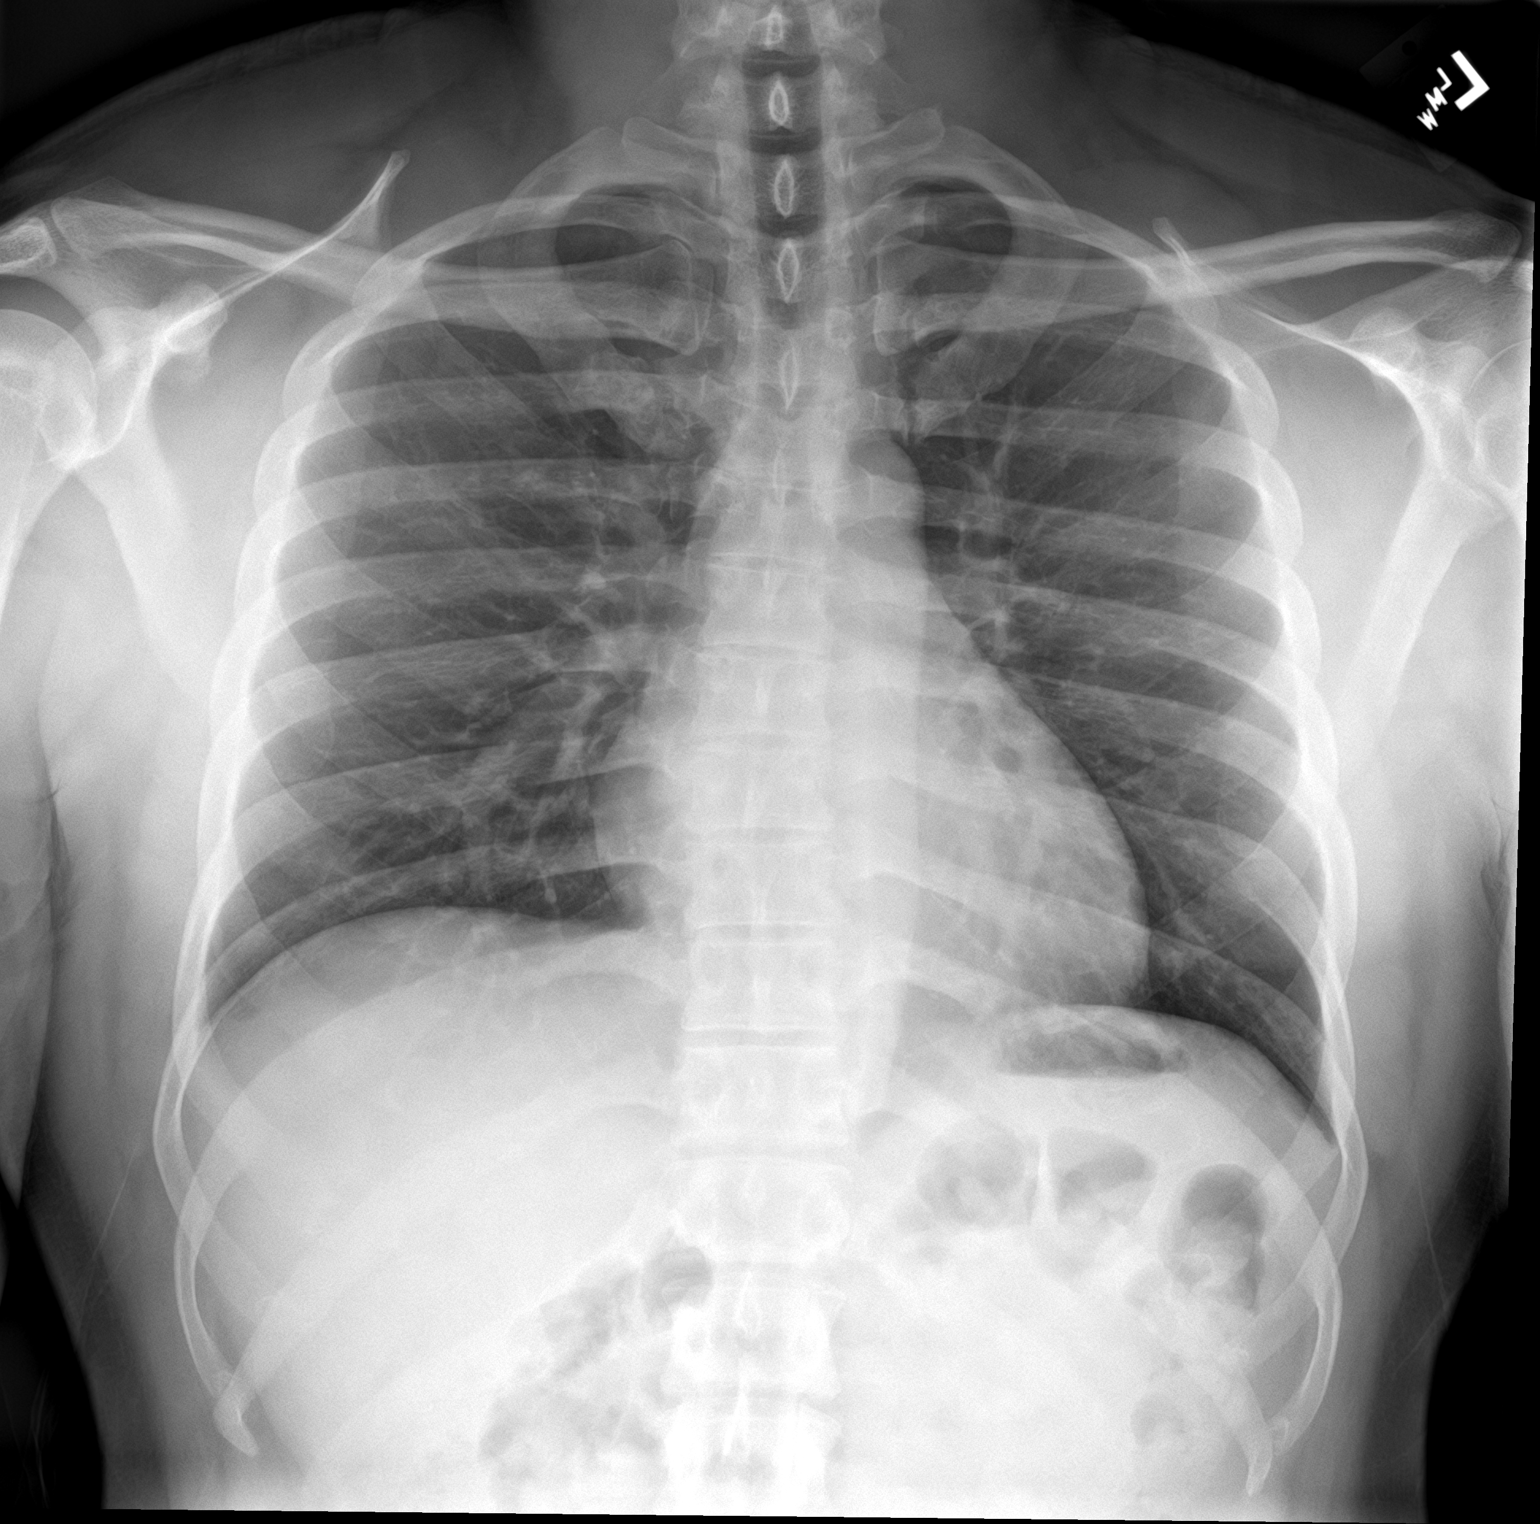

[chest lat]
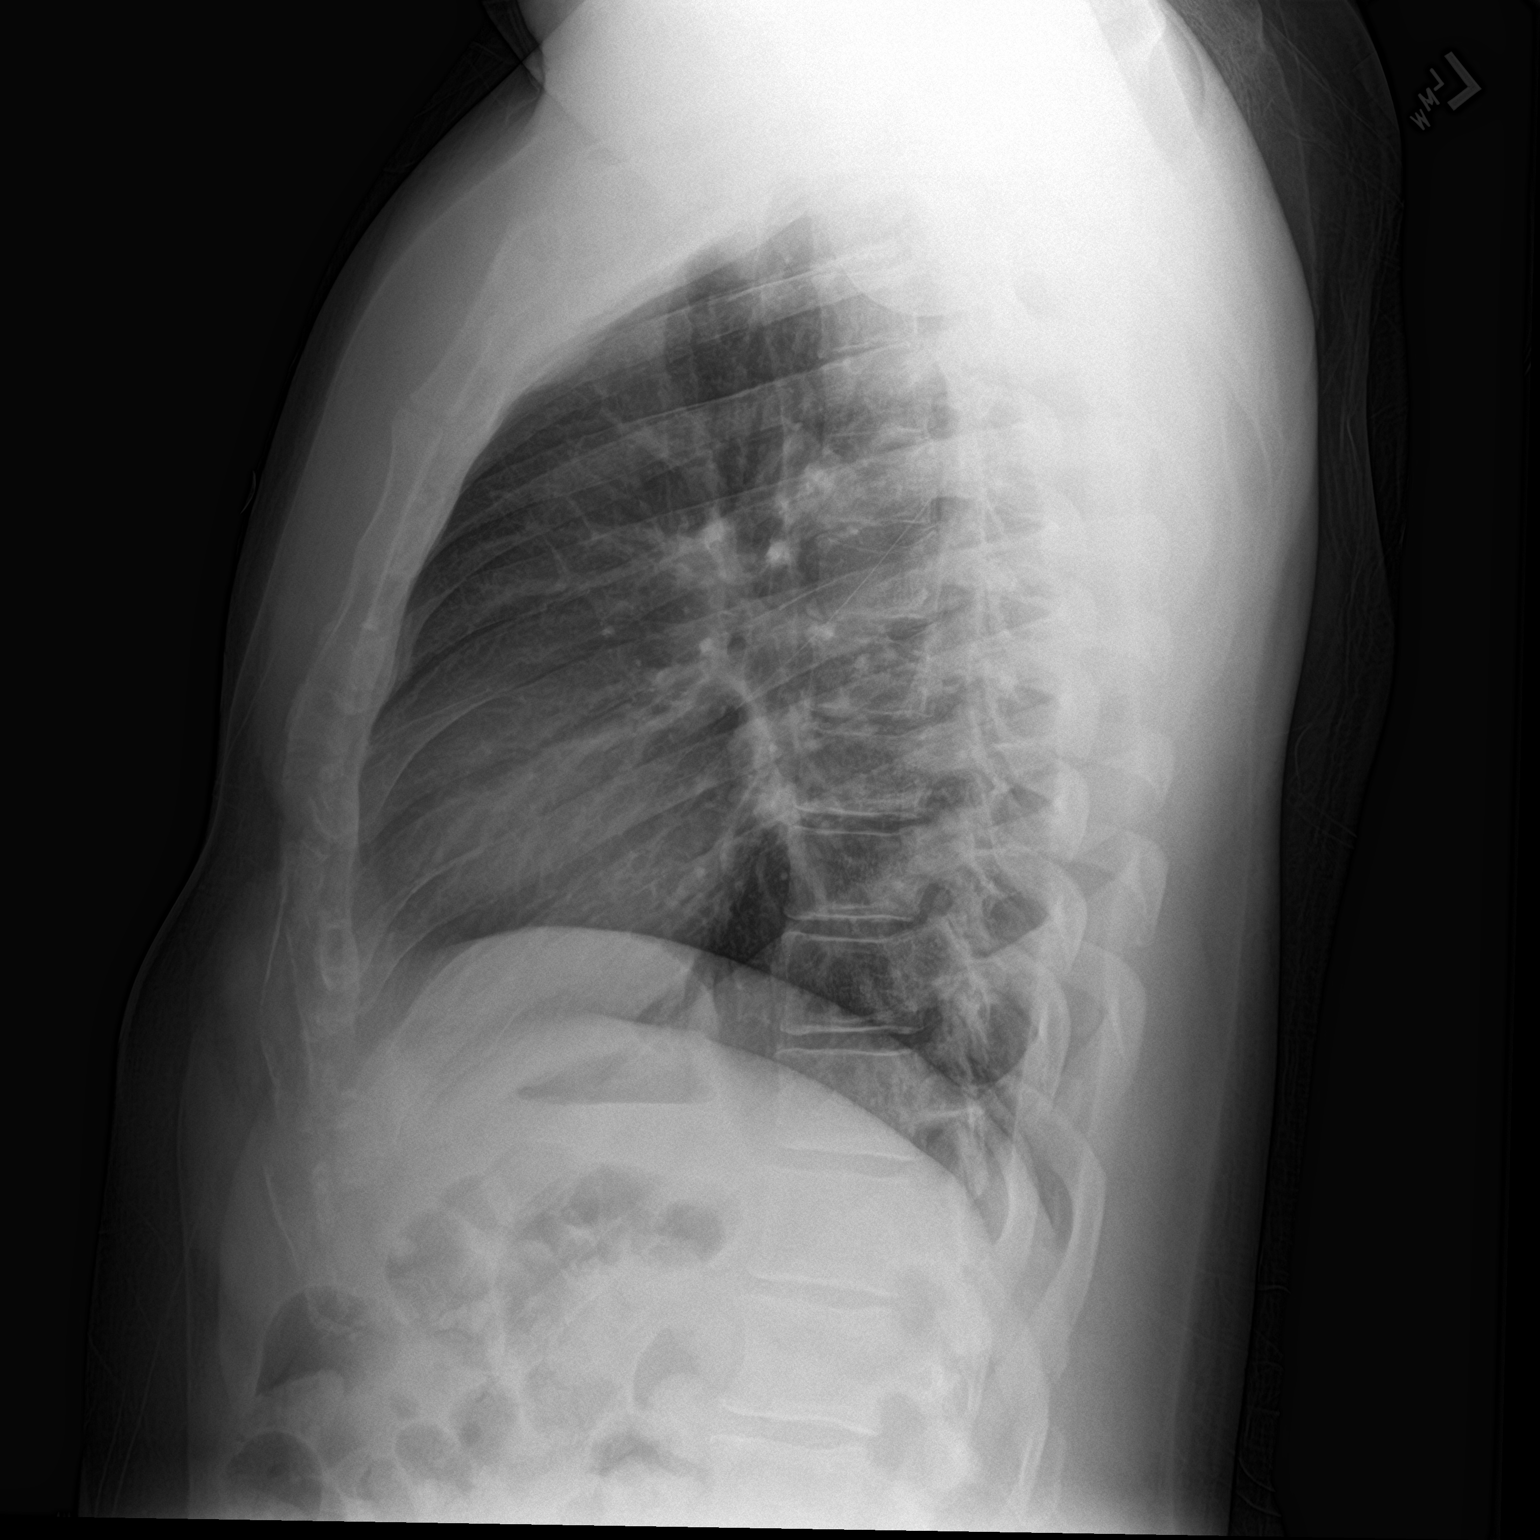

[2 of 2 positions shown; findings below may reference images not displayed]

FINDINGS: No consolidation, features of edema, pneumothorax, or effusion.
Pulmonary vascularity is normally distributed. The cardiomediastinal
contours are unremarkable. No acute osseous or soft tissue
abnormality.
IMPRESSION: No acute cardiopulmonary abnormality.

## 2020-11-16 ENCOUNTER — Emergency Department
Admission: EM | Admit: 2020-11-16 | Discharge: 2020-11-16 | Disposition: A | Discharge status: Home / Self Care | Payer: HRSA Program | Attending: Student in an Organized Health Care Education/Training Program | Admitting: Student in an Organized Health Care Education/Training Program

## 2020-11-16 ENCOUNTER — Emergency Department: Payer: HRSA Program

## 2020-11-16 ENCOUNTER — Encounter: Payer: Self-pay | Admitting: Emergency Medicine

## 2020-11-16 ENCOUNTER — Other Ambulatory Visit: Payer: Self-pay

## 2020-11-16 DIAGNOSIS — R5381 Other malaise: Secondary | ICD-10-CM | POA: Insufficient documentation

## 2020-11-16 DIAGNOSIS — R519 Headache, unspecified: Secondary | ICD-10-CM | POA: Diagnosis present

## 2020-11-16 DIAGNOSIS — Z87891 Personal history of nicotine dependence: Secondary | ICD-10-CM | POA: Diagnosis not present

## 2020-11-16 DIAGNOSIS — U071 COVID-19: Secondary | ICD-10-CM | POA: Insufficient documentation

## 2020-11-16 DIAGNOSIS — R110 Nausea: Secondary | ICD-10-CM | POA: Insufficient documentation

## 2020-11-16 LAB — LIPASE, BLOOD: Lipase: 26 U/L (ref 11–51)

## 2020-11-16 LAB — COMPREHENSIVE METABOLIC PANEL
ALT: 16 U/L (ref 0–44)
AST: 25 U/L (ref 15–41)
Albumin: 4.6 g/dL (ref 3.5–5.0)
Alkaline Phosphatase: 43 U/L (ref 38–126)
Anion gap: 9 (ref 5–15)
BUN: 10 mg/dL (ref 6–20)
CO2: 27 mmol/L (ref 22–32)
Calcium: 9.4 mg/dL (ref 8.9–10.3)
Chloride: 105 mmol/L (ref 98–111)
Creatinine, Ser: 0.92 mg/dL (ref 0.61–1.24)
GFR, Estimated: >60 mL/min (ref >60)
Glucose, Bld: 104 mg/dL — ABNORMAL HIGH (ref 70–99)
Potassium: 4.3 mmol/L (ref 3.5–5.1)
Sodium: 141 mmol/L (ref 135–145)
Total Bilirubin: 0.6 mg/dL (ref 0.3–1.2)
Total Protein: 7.6 g/dL (ref 6.5–8.1)

## 2020-11-16 LAB — CBC
HCT: 38.2 % — ABNORMAL LOW (ref 39.0–52.0)
Hemoglobin: 12.4 g/dL — ABNORMAL LOW (ref 13.0–17.0)
MCH: 27.3 pg (ref 26.0–34.0)
MCHC: 32.5 g/dL (ref 30.0–36.0)
MCV: 84.1 fL (ref 80.0–100.0)
Platelets: 234 10*3/uL (ref 150–400)
RBC: 4.54 MIL/uL (ref 4.22–5.81)
RDW: 12.5 % (ref 11.5–15.5)
WBC: 6 10*3/uL (ref 4.0–10.5)
nRBC: 0.3 % — ABNORMAL HIGH (ref 0.0–0.2)

## 2020-11-16 LAB — RESP PANEL BY RT-PCR (FLU A&B, COVID) ARPGX2
Influenza A by PCR: NEGATIVE
Influenza B by PCR: NEGATIVE
SARS Coronavirus 2 by RT PCR: POSITIVE — AB

## 2020-11-16 LAB — TROPONIN I (HIGH SENSITIVITY): Troponin I (High Sensitivity): <2 ng/L (ref <18)

## 2020-11-16 MED ORDER — ONDANSETRON 4 MG PO TBDP
4.0000 mg | ORAL_TABLET | Freq: Once | ORAL | Status: AC | PRN
  Administered 2020-11-16: 4 mg via ORAL
  Filled 2020-11-16: qty 1

## 2020-11-16 MED ORDER — ACETAMINOPHEN 325 MG PO TABS
650.0000 mg | ORAL_TABLET | Freq: Once | ORAL | Status: AC | PRN
  Administered 2020-11-16: 650 mg via ORAL
  Filled 2020-11-16: qty 2

## 2020-11-16 MED ORDER — ONDANSETRON HCL 4 MG PO TABS: 4.0000 mg | ORAL_TABLET | Freq: Every day | ORAL | 0 refills | Status: AC | PRN

## 2020-11-16 MED ORDER — ALBUTEROL SULFATE HFA 108 (90 BASE) MCG/ACT IN AERS: 2.0000 | INHALATION_SPRAY | Freq: Four times a day (QID) | RESPIRATORY_TRACT | 2 refills | Status: AC | PRN

## 2020-11-16 NOTE — ED Triage Notes (Signed)
Pt to ED from home c/o fever, body aches, cough, nausea for a couple days.  Unknown exposure to anyone sick.  States some abd pain, denies urinary changes.

## 2020-11-16 NOTE — ED Provider Notes (Signed)
[facility] Emergency Department Provider Note    Event Date/Time   First MD Initiated Contact with Patient 11/16/20 [phone]     (approximate)  I have reviewed the triage vital signs and the nursing notes.   HISTORY  Chief Complaint Fever and Nasal Congestion (/)    HPI Willie Ramsey. is a 29 y.o. male presents to the ER for evaluation of headache malaise nausea present over the past 12 to 24 hours.  Was otherwise feeling well yesterday.  He is vaccinated.  Denies any chest pain or shortness of breath.  Denies any numbness or tingling.  Denies any abdominal pain.  Received Zofran in triage with improvement in symptoms.    History reviewed. No pertinent past medical history. History reviewed. No pertinent family history. Past Surgical History:  Procedure Laterality Date  . KNEE SURGERY Left    There are no problems to display for this patient.     Prior to Admission medications   Medication Sig Start Date End Date Taking? Authorizing Provider  acetaminophen-codeine (TYLENOL #3) 300-30 MG per tablet Take 1-2 tablets by mouth every 6 (six) hours as needed for severe pain. 01/31/14   Piepenbrink, Victorino Dike, PA-C  albuterol (VENTOLIN HFA) 108 (90 Base) MCG/ACT inhaler Inhale 2 puffs into the lungs every 6 (six) hours as needed for wheezing or shortness of breath. 11/16/20   Willy Eddy, MD  ibuprofen (ADVIL,MOTRIN) 800 MG tablet Take 1 tablet (800 mg total) by mouth 3 (three) times daily. 01/31/14   Piepenbrink, Victorino Dike, PA-C  Multiple Vitamin (MULTI-VITAMIN PO) Take 1 tablet by mouth daily.    [provider]  ondansetron (ZOFRAN) 4 MG tablet Take 1 tablet (4 mg total) by mouth daily as needed. 11/16/20 11/16/21  Willy Eddy, MD  pantoprazole (PROTONIX) 40 MG tablet Take 1 tablet (40 mg total) by mouth 2 (two) times daily. 03/07/20 04/06/20  Darci Current, MD    Allergies Patient has no known allergies.    Social  History Social History   Tobacco Use  . Smoking status: Former Games developer  . Smokeless tobacco: Never Used  Substance Use Topics  . Alcohol use: No    Comment: socially  . Drug use: No    Review of Systems Patient denies headaches, rhinorrhea, blurry vision, numbness, shortness of breath, chest pain, edema, cough, abdominal pain, nausea, vomiting, diarrhea, dysuria, fevers, rashes or hallucinations unless otherwise stated above in HPI. ____________________________________________   PHYSICAL EXAM:  VITAL SIGNS: Vitals:   11/16/20 0450  BP: 136/86  Pulse: 93  Resp: 16  Temp: (!) 100.4 F (38 C)  SpO2: 99%    Constitutional: Alert and oriented.  Eyes: Conjunctivae are normal.  Head: Atraumatic. Nose: No congestion/rhinnorhea. Mouth/Throat: Mucous membranes are moist.   Neck: No stridor. Painless ROM.  Cardiovascular: Normal rate, regular rhythm. Grossly normal heart sounds.  Good peripheral circulation. Respiratory: Normal respiratory effort.  No retractions. Lungs CTAB. Gastrointestinal: Soft and nontender. No distention. No abdominal bruits. No CVA tenderness. Genitourinary:  Musculoskeletal: No lower extremity tenderness nor edema.  No joint effusions. Neurologic:  Normal speech and language. No gross focal neurologic deficits are appreciated. No facial droop Skin:  Skin is warm, dry and intact. No rash noted. Psychiatric: Mood and affect are normal. Speech and behavior are normal.  ____________________________________________   LABS (all labs ordered are listed, but only abnormal results are displayed)  Results for orders placed or performed during the hospital encounter of 11/16/20 (from the past 24 hour(s))  Lipase, blood     Status: None   Collection Time: 11/16/20  4:58 AM  Result Value Ref Range   Lipase 26 11 - 51 U/L  Comprehensive metabolic panel     Status: Abnormal   Collection Time: 11/16/20  4:58 AM  Result Value Ref Range   Sodium 141 135 - 145  mmol/L   Potassium 4.3 3.5 - 5.1 mmol/L   Chloride 105 98 - 111 mmol/L   CO2 27 22 - 32 mmol/L   Glucose, Bld 104 (H) 70 - 99 mg/dL   BUN 10 6 - 20 mg/dL   Creatinine, Ser 7.78 0.61 - 1.24 mg/dL   Calcium 9.4 8.9 - 24.2 mg/dL   Total Protein 7.6 6.5 - 8.1 g/dL   Albumin 4.6 3.5 - 5.0 g/dL   AST 25 15 - 41 U/L   ALT 16 0 - 44 U/L   Alkaline Phosphatase 43 38 - 126 U/L   Total Bilirubin 0.6 0.3 - 1.2 mg/dL   GFR, Estimated >35 >36 mL/min   Anion gap 9 5 - 15  CBC     Status: Abnormal   Collection Time: 11/16/20  4:58 AM  Result Value Ref Range   WBC 6.0 4.0 - 10.5 K/uL   RBC 4.54 4.22 - 5.81 MIL/uL   Hemoglobin 12.4 (L) 13.0 - 17.0 g/dL   HCT 14.4 (L) 31.5 - 40.0 %   MCV 84.1 80.0 - 100.0 fL   MCH 27.3 26.0 - 34.0 pg   MCHC 32.5 30.0 - 36.0 g/dL   RDW 86.7 61.9 - 50.9 %   Platelets 234 150 - 400 K/uL   nRBC 0.3 (H) 0.0 - 0.2 %  Resp Panel by RT-PCR (Flu A&B, Covid) Nasopharyngeal Swab     Status: Abnormal   Collection Time: 11/16/20  4:58 AM   Specimen: Nasopharyngeal Swab; Nasopharyngeal(NP) swabs in vial transport medium  Result Value Ref Range   SARS Coronavirus 2 by RT PCR POSITIVE (A) NEGATIVE   Influenza A by PCR NEGATIVE NEGATIVE   Influenza B by PCR NEGATIVE NEGATIVE  Troponin I (High Sensitivity)     Status: None   Collection Time: 11/16/20  4:58 AM  Result Value Ref Range   Troponin I (High Sensitivity) <2 <18 ng/L   ____________________________________________  EKG My review and personal interpretation at Time: 6:26   Indication: uri  Rate: 90  Rhythm: sinus Axis: normal Other: nonspecific st abn, no stemi ____________________________________________  RADIOLOGY  I personally reviewed all radiographic images ordered to evaluate for the above acute complaints and reviewed radiology reports and findings.  These findings were personally discussed with the patient.  Please see medical record for radiology  report.  ____________________________________________   PROCEDURES  Procedure(s) performed:  Procedures    Critical Care performed: no ____________________________________________   INITIAL IMPRESSION / ASSESSMENT AND PLAN / ED COURSE  Pertinent labs & imaging results that were available during my care of the patient were reviewed by me and considered in my medical decision making (see chart for details).   DDX: Covid, pneumonia, dehydration, electrolyte abnormality, myocarditis, sepsis  Peterson Parish Dubose. is a 29 y.o. who presents to the ED with symptoms as described above.  Patient febrile but clinically very well-appearing no respiratory symptoms.  EKG with some nonspecific changes but troponin is negative he is not complaining of any chest pain or shortness of breath.  Is not consistent with PE myocarditis or sepsis.  Symptoms consistent with mild Covid.  He is  clinically appropriate for outpatient follow-up and symptomatic management.  Patient agreeable to plan.     The patient was evaluated in Emergency Department today for the symptoms described in the history of present illness. He/she was evaluated in the context of the global COVID-19 pandemic, which necessitated consideration that the patient might be at risk for infection with the SARS-CoV-2 virus that causes COVID-19. Institutional protocols and algorithms that pertain to the evaluation of patients at risk for COVID-19 are in a state of rapid change based on information released by regulatory bodies including the CDC and federal and state organizations. These policies and algorithms were followed during the patient's care in the ED.  As part of my medical decision making, I reviewed the following data within the electronic MEDICAL RECORD NUMBER Nursing notes reviewed and incorporated, Labs reviewed, notes from prior ED visits and  Controlled Substance Database   ____________________________________________   FINAL  CLINICAL IMPRESSION(S) / ED DIAGNOSES  Final diagnoses:  COVID-19 virus infection      NEW MEDICATIONS STARTED DURING THIS VISIT:  New Prescriptions   ALBUTEROL (VENTOLIN HFA) 108 (90 BASE) MCG/ACT INHALER    Inhale 2 puffs into the lungs every 6 (six) hours as needed for wheezing or shortness of breath.   ONDANSETRON (ZOFRAN) 4 MG TABLET    Take 1 tablet (4 mg total) by mouth daily as needed.     Note:  This document was prepared using Dragon voice recognition software and may include unintentional dictation errors.    Willy Eddy, MD 11/16/20 0930
